# Patient Record
Sex: Female | Born: 2000 | Race: Black or African American | Hispanic: No | Marital: Single | Smoking: Never smoker
Health system: Southern US, Community
[De-identification: ages and names within clinical notes are randomized; demographics above are authoritative.]

## PROBLEM LIST (undated history)

## (undated) DIAGNOSIS — J45909 Unspecified asthma, uncomplicated: Secondary | ICD-10-CM

---
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
## 2021-03-01 ENCOUNTER — Other Ambulatory Visit: Payer: Self-pay

## 2021-03-01 ENCOUNTER — Encounter (HOSPITAL_COMMUNITY): Payer: Self-pay

## 2021-03-01 ENCOUNTER — Ambulatory Visit (HOSPITAL_COMMUNITY)
Admission: EM | Admit: 2021-03-01 | Discharge: 2021-03-01 | Disposition: A | Discharge status: Home / Self Care | Payer: Medicaid Other | Attending: Emergency Medicine | Admitting: Emergency Medicine

## 2021-03-01 ENCOUNTER — Emergency Department (HOSPITAL_COMMUNITY)
Admission: EM | Admit: 2021-03-01 | Discharge: 2021-03-01 | Disposition: A | Discharge status: Home / Self Care | Payer: Medicaid Other | Attending: Emergency Medicine | Admitting: Emergency Medicine

## 2021-03-01 ENCOUNTER — Emergency Department (HOSPITAL_COMMUNITY): Payer: Medicaid Other

## 2021-03-01 DIAGNOSIS — R079 Chest pain, unspecified: Secondary | ICD-10-CM | POA: Diagnosis not present

## 2021-03-01 DIAGNOSIS — R509 Fever, unspecified: Secondary | ICD-10-CM | POA: Insufficient documentation

## 2021-03-01 DIAGNOSIS — Z20822 Contact with and (suspected) exposure to covid-19: Secondary | ICD-10-CM | POA: Insufficient documentation

## 2021-03-01 DIAGNOSIS — Z5321 Procedure and treatment not carried out due to patient leaving prior to being seen by health care provider: Secondary | ICD-10-CM | POA: Diagnosis not present

## 2021-03-01 DIAGNOSIS — M25512 Pain in left shoulder: Secondary | ICD-10-CM

## 2021-03-01 DIAGNOSIS — R1030 Lower abdominal pain, unspecified: Secondary | ICD-10-CM | POA: Insufficient documentation

## 2021-03-01 DIAGNOSIS — R52 Pain, unspecified: Secondary | ICD-10-CM | POA: Diagnosis not present

## 2021-03-01 DIAGNOSIS — U071 COVID-19: Secondary | ICD-10-CM

## 2021-03-01 LAB — CBC
HCT: 31.2 % — ABNORMAL LOW (ref 36.0–46.0)
Hemoglobin: 9 g/dL — ABNORMAL LOW (ref 12.0–15.0)
MCH: 22.2 pg — ABNORMAL LOW (ref 26.0–34.0)
MCHC: 28.8 g/dL — ABNORMAL LOW (ref 30.0–36.0)
MCV: 76.8 fL — ABNORMAL LOW (ref 80.0–100.0)
Platelets: 320 10*3/uL (ref 150–400)
RBC: 4.06 MIL/uL (ref 3.87–5.11)
RDW: 15.4 % (ref 11.5–15.5)
WBC: 6 10*3/uL (ref 4.0–10.5)
nRBC: 0 % (ref 0.0–0.2)

## 2021-03-01 LAB — COMPREHENSIVE METABOLIC PANEL
ALT: 22 U/L (ref 0–44)
AST: 29 U/L (ref 15–41)
Albumin: 3.5 g/dL (ref 3.5–5.0)
Alkaline Phosphatase: 37 U/L — ABNORMAL LOW (ref 38–126)
Anion gap: 7 (ref 5–15)
BUN: 9 mg/dL (ref 6–20)
CO2: 23 mmol/L (ref 22–32)
Calcium: 9.1 mg/dL (ref 8.9–10.3)
Chloride: 108 mmol/L (ref 98–111)
Creatinine, Ser: 0.68 mg/dL (ref 0.44–1.00)
GFR, Estimated: >60 mL/min (ref >60)
Glucose, Bld: 97 mg/dL (ref 70–99)
Potassium: 4 mmol/L (ref 3.5–5.1)
Sodium: 138 mmol/L (ref 135–145)
Total Bilirubin: 0.4 mg/dL (ref 0.3–1.2)
Total Protein: 7 g/dL (ref 6.5–8.1)

## 2021-03-01 LAB — RESP PANEL BY RT-PCR (FLU A&B, COVID) ARPGX2
Influenza A by PCR: NEGATIVE
Influenza B by PCR: NEGATIVE
SARS Coronavirus 2 by RT PCR: NEGATIVE

## 2021-03-01 LAB — I-STAT BETA HCG BLOOD, ED (MC, WL, AP ONLY): I-stat hCG, quantitative: <5 m[IU]/mL (ref <5)

## 2021-03-01 LAB — LIPASE, BLOOD: Lipase: 35 U/L (ref 11–51)

## 2021-03-01 MED ORDER — PREDNISONE 10 MG (21) PO TBPK: ORAL_TABLET | Freq: Every day | ORAL | 0 refills | Status: DC

## 2021-03-01 NOTE — ED Provider Notes (Signed)
[facility]    CSN: [id number] Arrival date & time: 03/01/21  1327      History   Chief Complaint Chief Complaint  Patient presents with   Chest Pain    HPI Christine Barr is a 20 y.o. female.   Pt left the er with testing completed due to wait times and is here for lt shoulder pain, chest pain, body aches for the past few days. States that she is taking motrin but no relief. Covid and flu test negative from er.    Past Medical History:  Diagnosis Date   Asthma     There are no problems to display for this patient.   History reviewed. No pertinent surgical history.  OB History   No obstetric history on file.      Home Medications    Prior to Admission medications   Medication Sig Start Date End Date Taking? Authorizing Provider  predniSONE (STERAPRED UNI-PAK 21 TAB) 10 MG (21) TBPK tablet Take by mouth daily. Take 6 tabs by mouth daily  for 2 days, then 5 tabs for 2 days, then 4 tabs for 2 days, then 3 tabs for 2 days, 2 tabs for 2 days, then 1 tab by mouth daily for 2 days 03/01/21  Yes Coralyn Mark, NP  HYDROcodone-acetaminophen (NORCO) 5-325 MG tablet Take 1 tablet by mouth every 4 (four) hours as needed for moderate pain. 10/31/20   Gilles Chiquito, MD  ondansetron (ZOFRAN) 4 MG tablet Take 1 tablet (4 mg total) by mouth every 8 (eight) hours as needed for up to 10 doses for nausea or vomiting. 10/31/20   Gilles Chiquito, MD    Family History History reviewed. No pertinent family history.  Social History Social History   Tobacco Use   Smoking status: Passive Smoke Exposure - Never Smoker   Smokeless tobacco: Never  Substance Use Topics   Alcohol use: No     Allergies   Patient has no known allergies.   Review of Systems Review of Systems  Constitutional:  Positive for fatigue.  HENT:  Positive for congestion and rhinorrhea.   Eyes: Negative.   Respiratory:  Positive for cough.   Cardiovascular:  Positive for chest pain.   Genitourinary: Negative.        Currently on cycle   Musculoskeletal:        Lt shoulder pain   Neurological: Negative.     Physical Exam Triage Vital Signs ED Triage Vitals  Enc Vitals Group     BP 03/01/21 1401 105/64     Pulse Rate 03/01/21 1401 80     Resp 03/01/21 1401 18     Temp 03/01/21 1401 98.3 F (36.8 C)     Temp Source 03/01/21 1401 Oral     SpO2 03/01/21 1401 99 %     Weight --      Height --      Head Circumference --      Peak Flow --      Pain Score 03/01/21 1358 8     Pain Loc --      Pain Edu? --      Excl. in [place]? --    No data found.  Updated Vital Signs BP 105/64 (BP Location: Right Arm)   Pulse 80   Temp 98.3 F (36.8 C) (Oral)   Resp 18   LMP 02/21/2021 (Approximate)   SpO2 99%   Visual Acuity Right Eye Distance:   Left Eye  Distance:   Bilateral Distance:    Right Eye Near:   Left Eye Near:    Bilateral Near:     Physical Exam Constitutional:      Appearance: She is well-developed.  Cardiovascular:     Rate and Rhythm: Normal rate.     Heart sounds: Normal heart sounds.  Pulmonary:     Breath sounds: Normal breath sounds.  Abdominal:     Palpations: Abdomen is soft.  Musculoskeletal:        General: Normal range of motion.     Cervical back: Normal range of motion.  Skin:    General: Skin is warm.  Neurological:     General: No focal deficit present.     Mental Status: She is alert.     UC Treatments / Results  Labs (all labs ordered are listed, but only abnormal results are displayed) Labs Reviewed - No data to display  EKG   Radiology DG Chest 1 View  Result Date: 03/01/2021 CLINICAL DATA:  Malaise, fever and left upper chest pain. EXAM: CHEST  1 VIEW COMPARISON:  01/08/2016 FINDINGS: The heart size and mediastinal contours are within normal limits. There is no evidence of pulmonary edema, consolidation, pneumothorax or pleural fluid. The visualized skeletal structures are unremarkable. IMPRESSION: No active  disease. Electronically Signed   By: Irish Lack M.D.   On: 03/01/2021 08:58    Procedures Procedures (including critical care time)  Medications Ordered in UC Medications - No data to display  Initial Impression / Assessment and Plan / UC Course  I have reviewed the triage vital signs and the nursing notes.  Pertinent labs & imaging results that were available during my care of the patient were reviewed by me and considered in my medical decision making (see chart for details).     You may have a viral illness your test from the er was negative  You are slightly anemic you are currently on your cycle make sure to push fluids stay hydrated  You may have symptoms upto 10 days  Take tylenol or motrin as needed for pain  Final Clinical Impressions(s) / UC Diagnoses   Final diagnoses:  Chest pain, unspecified type  Body aches  Pain in joint of left shoulder     Discharge Instructions      You may have a viral illness your test from the er was negative  You are slightly anemic you are currently on your cycle make sure to push fluids stay hydrated  You may have symptoms upto 10 days  Take tylenol or motrin as needed for pain      ED Prescriptions     Medication Sig Dispense Auth. Provider   predniSONE (STERAPRED UNI-PAK 21 TAB) 10 MG (21) TBPK tablet Take by mouth daily. Take 6 tabs by mouth daily  for 2 days, then 5 tabs for 2 days, then 4 tabs for 2 days, then 3 tabs for 2 days, 2 tabs for 2 days, then 1 tab by mouth daily for 2 days 42 tablet Coralyn Mark, NP      PDMP not reviewed this encounter.   Coralyn Mark, NP 03/01/21 1444

## 2021-03-01 NOTE — ED Triage Notes (Addendum)
Pt reports having pain that starts in shoulder and radiates to chest (sharp). There is a history of asthma. Pt states she has been having more headaches and for the past week she has been having abd cramping to LLQ.  Start: this morning

## 2021-03-01 NOTE — Discharge Instructions (Addendum)
You may have a viral illness your test from the er was negative  You are slightly anemic you are currently on your cycle make sure to push fluids stay hydrated  You may have symptoms upto 10 days  Take tylenol or motrin as needed for pain

## 2021-03-01 NOTE — ED Triage Notes (Signed)
Pt states she has been feeling general malaise for several days. Fever two days ago, treated with tylenol. States she woke up this morning and was also having L shoulder pain, no known injury to area. Pt also states she has lower abdominal pain.

## 2021-09-02 ENCOUNTER — Other Ambulatory Visit: Payer: Self-pay

## 2021-09-02 ENCOUNTER — Encounter: Payer: Self-pay | Admitting: Emergency Medicine

## 2021-09-02 ENCOUNTER — Emergency Department
Admission: EM | Admit: 2021-09-02 | Discharge: 2021-09-02 | Disposition: A | Discharge status: Home / Self Care | Payer: Medicaid Other | Attending: Emergency Medicine | Admitting: Emergency Medicine

## 2021-09-02 DIAGNOSIS — T7840XA Allergy, unspecified, initial encounter: Secondary | ICD-10-CM

## 2021-09-02 MED ORDER — IPRATROPIUM-ALBUTEROL 0.5-2.5 (3) MG/3ML IN SOLN
3.0000 mL | Freq: Once | RESPIRATORY_TRACT | Status: AC
  Administered 2021-09-02: 3 mL via RESPIRATORY_TRACT
  Filled 2021-09-02: qty 3

## 2021-09-02 MED ORDER — PREDNISONE 20 MG PO TABS
50.0000 mg | ORAL_TABLET | Freq: Once | ORAL | Status: AC
  Administered 2021-09-02: 50 mg via ORAL
  Filled 2021-09-02: qty 3

## 2021-09-02 MED ORDER — PREDNISONE 50 MG PO TABS: 50.0000 mg | ORAL_TABLET | Freq: Every day | ORAL | 0 refills | Status: AC

## 2021-09-02 NOTE — ED Triage Notes (Signed)
Pt via POV from home. Pt states that she woke up with itchy hives. States that her lips were swollen but it has gone down. Pt took Benadryl at 0900 this AM. Pt is A&Ox4 and NAD.

## 2021-09-02 NOTE — ED Provider Notes (Signed)
[facility] Provider Note    Event Date/Time   First MD Initiated Contact with Patient 09/02/21 1645     (approximate)   History   Allergic Reaction   HPI  Christine Barr is a 21 y.o. female with no significant past medical history who presents with complaints of allergic reaction.  Patient reports she woke up this morning with mild swelling of her lips and an itchy rash consistent with hives.  She reports she took Benadryl and symptoms improved.  Denies difficulty swallowing or breathing although occasionally she feels a tightness in her chest.  She reports itching returned at work so she came to the emergency department.  No history of anaphylaxis or allergic reactions.     Physical Exam   Triage Vital Signs: ED Triage Vitals  Enc Vitals Group     BP 09/02/21 1648 122/76     Pulse Rate 09/02/21 1648 99     Resp 09/02/21 1648 18     Temp 09/02/21 1648 98.6 F (37 C)     Temp Source 09/02/21 1648 Oral     SpO2 09/02/21 1648 100 %     Weight 09/02/21 1640 63.5 kg (140 lb)     Height 09/02/21 1640 1.626 m (5\' 4" )     Head Circumference --      Peak Flow --      Pain Score 09/02/21 1640 0     Pain Loc --      Pain Edu? --      Excl. in [place]? --     Most recent vital signs: Vitals:   09/02/21 1648  BP: 122/76  Pulse: 99  Resp: 18  Temp: 98.6 F (37 C)  SpO2: 100%     General: Awake, no distress.  CV:  Good peripheral perfusion.  Resp:  Normal effort.  Clear to auscultation bilaterally Abd:  No distention.  Other:  Pharynx normal, no swelling, no stridor, no rash   ED Results / Procedures / Treatments   Labs (all labs ordered are listed, but only abnormal results are displayed) Labs Reviewed - No data to display   EKG     RADIOLOGY     PROCEDURES:  Critical Care performed:   Procedures   MEDICATIONS ORDERED IN ED: Medications  ipratropium-albuterol (DUONEB) 0.5-2.5 (3) MG/3ML nebulizer solution 3 mL (3 mLs  Nebulization Given 09/02/21 1656)  predniSONE (DELTASONE) tablet 50 mg (50 mg Oral Given 09/02/21 1655)     IMPRESSION / MDM / ASSESSMENT AND PLAN / ED COURSE  I reviewed the triage vital signs and the nursing notes.  Patient overall well-appearing in no acute distress presents with symptoms consistent with allergic reaction.  Exam is overall reassuring.  Will treat with p.o. prednisone, patient will be driving so advised her to take additional Benadryl when she gets home.  Will give DuoNeb as well  No indication for admission and is not consistent with anaphylaxis, patient is to return if any change in her symptoms.          FINAL CLINICAL IMPRESSION(S) / ED DIAGNOSES   Final diagnoses:  Allergic reaction, initial encounter     Rx / DC Orders   ED Discharge Orders          Ordered    predniSONE (DELTASONE) 50 MG tablet  Daily with breakfast        09/02/21 1701             Note:  This  document was prepared using Systems analyst and may include unintentional dictation errors.   Lavonia Drafts, MD 09/02/21 [phone]

## 2022-09-11 ENCOUNTER — Ambulatory Visit (HOSPITAL_COMMUNITY)
Admission: EM | Admit: 2022-09-11 | Discharge: 2022-09-11 | Disposition: A | Discharge status: Home / Self Care | Payer: Medicaid Other | Attending: Internal Medicine | Admitting: Internal Medicine

## 2022-09-11 ENCOUNTER — Other Ambulatory Visit: Payer: Self-pay

## 2022-09-11 ENCOUNTER — Encounter (HOSPITAL_COMMUNITY): Payer: Self-pay | Admitting: Emergency Medicine

## 2022-09-11 DIAGNOSIS — Z3202 Encounter for pregnancy test, result negative: Secondary | ICD-10-CM

## 2022-09-11 DIAGNOSIS — R112 Nausea with vomiting, unspecified: Secondary | ICD-10-CM | POA: Diagnosis not present

## 2022-09-11 DIAGNOSIS — N3000 Acute cystitis without hematuria: Secondary | ICD-10-CM | POA: Diagnosis not present

## 2022-09-11 LAB — POCT URINALYSIS DIPSTICK, ED / UC
Bilirubin Urine: NEGATIVE
Glucose, UA: NEGATIVE mg/dL
Hgb urine dipstick: NEGATIVE
Ketones, ur: NEGATIVE mg/dL
Leukocytes,Ua: NEGATIVE
Nitrite: POSITIVE — AB
Protein, ur: NEGATIVE mg/dL
Specific Gravity, Urine: 1.025 (ref 1.005–1.030)
Urobilinogen, UA: 1 mg/dL (ref 0.0–1.0)
pH: 7 (ref 5.0–8.0)

## 2022-09-11 LAB — POC URINE PREG, ED: Preg Test, Ur: NEGATIVE

## 2022-09-11 MED ORDER — ONDANSETRON 4 MG PO TBDP: 4.0000 mg | ORAL_TABLET | Freq: Three times a day (TID) | ORAL | 0 refills | Status: AC | PRN

## 2022-09-11 MED ORDER — ONDANSETRON 4 MG PO TBDP
4.0000 mg | ORAL_TABLET | Freq: Once | ORAL | Status: AC
  Administered 2022-09-11: 4 mg via ORAL

## 2022-09-11 MED ORDER — ONDANSETRON 4 MG PO TBDP
ORAL_TABLET | ORAL | Status: AC
  Filled 2022-09-11: qty 1

## 2022-09-11 MED ORDER — CEPHALEXIN 500 MG PO CAPS: 500.0000 mg | ORAL_CAPSULE | Freq: Two times a day (BID) | ORAL | 0 refills | Status: AC

## 2022-09-11 NOTE — ED Provider Notes (Signed)
[place]    CSN: [id number] Arrival date & time: 09/11/22  0857      History   Chief Complaint Chief Complaint  Patient presents with   Emesis    HPI Christine Barr is a 22 y.o. female.   Patient presents urgent care for evaluation of nausea, vomiting, suprapubic abdominal discomfort, low back pain that started 3 days ago.  She has had multiple episodes of nonbilious/nonbloody emesis over the last 3 days.  She denies diarrhea, fever/chills, viral URI symptoms, sore throat, flank pain, urinary symptoms, vaginal symptoms, rash, and recent intake of foods outside of normal diet.  She denies acid reflux symptoms, epigastric abdominal discomfort, neck pain, generalized bodyaches, recent COVID-19 infection, and recent known sick contacts.  No recent antibiotic or steroid use.  Denies recent new sexual partners or known exposure to STD.  She is sexually active without condoms with her monogamous female partner.  Last menstrual cycle was September 04, 2022.  Menstrual cycles are regular.  She has not attempted use of any over-the-counter medications before coming to urgent care for her symptoms.   Emesis   Past Medical History:  Diagnosis Date   Asthma     There are no problems to display for this patient.   History reviewed. No pertinent surgical history.  OB History   No obstetric history on file.      Home Medications    Prior to Admission medications   Medication Sig Start Date End Date Taking? Authorizing Provider  cephALEXin (KEFLEX) 500 MG capsule Take 1 capsule (500 mg total) by mouth 2 (two) times daily for 7 days. 09/11/22 09/18/22 Yes Talbot Grumbling, FNP  ondansetron (ZOFRAN-ODT) 4 MG disintegrating tablet Take 1 tablet (4 mg total) by mouth every 8 (eight) hours as needed for nausea or vomiting. 09/11/22  Yes Talbot Grumbling, FNP  HYDROcodone-acetaminophen (NORCO) 5-325 MG tablet Take 1 tablet by mouth every 4 (four) hours as needed for  moderate pain. Patient not taking: Reported on 09/11/2022 10/31/20   Lucrezia Starch, MD  predniSONE (DELTASONE) 50 MG tablet Take 1 tablet (50 mg total) by mouth daily with breakfast. Patient not taking: Reported on 09/11/2022 09/02/21   Lavonia Drafts, MD    Family History History reviewed. No pertinent family history.  Social History Social History   Tobacco Use   Smoking status: Never    Passive exposure: Yes   Smokeless tobacco: Never  Vaping Use   Vaping Use: Every day  Substance Use Topics   Alcohol use: Not Currently   Drug use: Never     Allergies   Patient has no known allergies.   Review of Systems Review of Systems  Gastrointestinal:  Positive for vomiting.  Per HPI   Physical Exam Triage Vital Signs ED Triage Vitals  Enc Vitals Group     BP 09/11/22 1108 117/75     Pulse Rate 09/11/22 1108 67     Resp 09/11/22 1108 16     Temp 09/11/22 1108 98.5 F (36.9 C)     Temp Source 09/11/22 1108 Oral     SpO2 09/11/22 1108 100 %     Weight --      Height --      Head Circumference --      Peak Flow --      Pain Score 09/11/22 1105 6     Pain Loc --      Pain Edu? --      Excl.  in [place]? --    No data found.  Updated Vital Signs BP 117/75 (BP Location: Left Arm)   Pulse 67   Temp 98.5 F (36.9 C) (Oral)   Resp 16   LMP 09/04/2022   SpO2 100%   Visual Acuity Right Eye Distance:   Left Eye Distance:   Bilateral Distance:    Right Eye Near:   Left Eye Near:    Bilateral Near:     Physical Exam Vitals and nursing note reviewed.  Constitutional:      Appearance: She is not ill-appearing or toxic-appearing.  HENT:     Head: Normocephalic and atraumatic.     Right Ear: Hearing and external ear normal.     Left Ear: Hearing and external ear normal.     Nose: Nose normal.     Mouth/Throat:     Lips: Pink.     Mouth: Mucous membranes are moist. No injury.     Tongue: No lesions. Tongue does not deviate from midline.     Palate: No mass and  lesions.     Pharynx: Oropharynx is clear. Uvula midline. No pharyngeal swelling, oropharyngeal exudate, posterior oropharyngeal erythema or uvula swelling.     Tonsils: No tonsillar exudate or tonsillar abscesses.  Eyes:     General: Lids are normal. Vision grossly intact. Gaze aligned appropriately.     Extraocular Movements: Extraocular movements intact.     Conjunctiva/sclera: Conjunctivae normal.  Cardiovascular:     Rate and Rhythm: Normal rate and regular rhythm.     Heart sounds: Normal heart sounds, S1 normal and S2 normal.  Pulmonary:     Effort: Pulmonary effort is normal. No respiratory distress.     Breath sounds: Normal breath sounds and air entry.  Abdominal:     General: Abdomen is flat. Bowel sounds are normal.     Palpations: Abdomen is soft.     Tenderness: There is abdominal tenderness in the suprapubic area. There is no right CVA tenderness, left CVA tenderness or guarding.     Comments: No peritoneal signs.  Musculoskeletal:     Cervical back: Neck supple.  Skin:    General: Skin is warm and dry.     Capillary Refill: Capillary refill takes less than 2 seconds.     Findings: No rash.  Neurological:     General: No focal deficit present.     Mental Status: She is alert and oriented to person, place, and time. Mental status is at baseline.     Cranial Nerves: No dysarthria or facial asymmetry.  Psychiatric:        Mood and Affect: Mood normal.        Speech: Speech normal.        Behavior: Behavior normal.        Thought Content: Thought content normal.        Judgment: Judgment normal.      UC Treatments / Results  Labs (all labs ordered are listed, but only abnormal results are displayed) Labs Reviewed  POCT URINALYSIS DIPSTICK, ED / UC - Abnormal; Notable for the following components:      Result Value   Nitrite POSITIVE (*)    All other components within normal limits  URINE CULTURE  POC URINE PREG, ED    EKG   Radiology No results  found.  Procedures Procedures (including critical care time)  Medications Ordered in UC Medications  ondansetron (ZOFRAN-ODT) disintegrating tablet 4 mg (4 mg Oral Given 09/11/22 1112)  Initial Impression / Assessment and Plan / UC Course  I have reviewed the triage vital signs and the nursing notes.  Pertinent labs & imaging results that were available during my care of the patient were reviewed by me and considered in my medical decision making (see chart for details).   1.  Nausea and vomiting, acute cystitis without hematuria, negative pregnancy test Urinalysis is nitrite positive without leukocytes or hematuria.  I would like to go ahead and treat for likely urinary tract infection based on patient's symptoms and positive nitrites in urine.  Keflex twice daily for the next 7 days sent to pharmacy to be taken as directed.  Advised to take this medicine with food to avoid stomach upset.  She is to push fluids to stay well-hydrated.  Zofran 4 mg ODT given in clinic, may use this at home every 8 hours as needed for nausea and vomiting as well.  She is clinically nontoxic in appearance with hemodynamically stable vital signs.  No peritoneal signs to abdominal exam, therefore no indication for referral to the emergency department.  She is not tender to her flank region and pregnancy test is negative.  Bland foods for the next 12 to 24 hours recommended, then may increase diet as tolerated.  She is agreeable with this plan.  Work note given.   Discussed physical exam and available lab work findings in clinic with patient.  Counseled patient regarding appropriate use of medications and potential side effects for all medications recommended or prescribed today. Discussed red flag signs and symptoms of worsening condition,when to call the PCP office, return to urgent care, and when to seek higher level of care in the emergency department. Patient verbalizes understanding and agreement with plan. All  questions answered. Patient discharged in stable condition.    Final Clinical Impressions(s) / UC Diagnoses   Final diagnoses:  Nausea and vomiting, unspecified vomiting type  Acute cystitis without hematuria     Discharge Instructions      Your evaluation suggests that your symptoms are most likely due to viral stomach illness (gastroenteritis) which will improve on its own with rest and fluids in the next few days.   You also have urinary tract affection.  I would like for you to take Keflex antibiotic twice daily for the next 7 days to treat the UTI. Drink plenty of fluids and avoid liquids like soda, coffee, and other caffeinated beverages as these can make you dehydrated. I have sent your urine for culture in the lab, I will call you if we need to change your antibiotic based off of the urine culture.  I have prescribed an antinausea medication for you to take at home called Zofran. It is the same medication that we gave you in the office.  You may use tylenol 1,033m every 6 hours over the counter as needed for abdominal discomfort related to this virus.   Eat a bland diet for the next 12-24 hours (bananas, rice, white toast, and applesauce) once you are able to tolerate liquids (broth, etc). These foods are easy for your stomach to digest. Pedialyte can be purchased to help with rehydration. Drink plenty of water.   Please follow up with your primary care provider for further management. Return if you experience worsening or uncontrolled pain, inability to tolerate fluids by mouth, difficulty breathing, fevers 100.80F or greater, recurrent vomiting, or any other concerning symptoms. I hope you feel better!      ED Prescriptions  Medication Sig Dispense Auth. Provider   cephALEXin (KEFLEX) 500 MG capsule Take 1 capsule (500 mg total) by mouth 2 (two) times daily for 7 days. 14 capsule Joella Prince M, FNP   ondansetron (ZOFRAN-ODT) 4 MG disintegrating tablet Take 1  tablet (4 mg total) by mouth every 8 (eight) hours as needed for nausea or vomiting. 20 tablet Talbot Grumbling, FNP      PDMP not reviewed this encounter.   Talbot Grumbling, [place] 09/11/22 1214

## 2022-09-11 NOTE — Discharge Instructions (Addendum)
Your evaluation suggests that your symptoms are most likely due to viral stomach illness (gastroenteritis) which will improve on its own with rest and fluids in the next few days.   You also have urinary tract affection.  I would like for you to take Keflex antibiotic twice daily for the next 7 days to treat the UTI. Drink plenty of fluids and avoid liquids like soda, coffee, and other caffeinated beverages as these can make you dehydrated. I have sent your urine for culture in the lab, I will call you if we need to change your antibiotic based off of the urine culture.  I have prescribed an antinausea medication for you to take at home called Zofran. It is the same medication that we gave you in the office.  You may use tylenol 1,042m every 6 hours over the counter as needed for abdominal discomfort related to this virus.   Eat a bland diet for the next 12-24 hours (bananas, rice, white toast, and applesauce) once you are able to tolerate liquids (broth, etc). These foods are easy for your stomach to digest. Pedialyte can be purchased to help with rehydration. Drink plenty of water.   Please follow up with your primary care provider for further management. Return if you experience worsening or uncontrolled pain, inability to tolerate fluids by mouth, difficulty breathing, fevers 100.89F or greater, recurrent vomiting, or any other concerning symptoms. I hope you feel better!

## 2022-09-11 NOTE — ED Triage Notes (Signed)
Symptoms started last night around 10 pm.  Patient reports abdominal cramping, back pain, nausea, lightheadedness.   Vomiting started today.  Has had 3 episodes of vomiting today.  Denies diarrhea.  Denies urinary issues.  Denies vaginal discharge.  Last BM was yesterday and normal per patient.    Has not had any medicines for symptoms

## 2022-09-12 LAB — URINE CULTURE: Culture: >=100000 — AB

## 2022-10-22 IMAGING — CT CT RENAL STONE PROTOCOL
2 of 4 series · 16 of 46 positions shown, 18 images · non-contrast
Comparison: None.

CLINICAL DATA: Left upper quadrant pain.

EXAM:
CT ABDOMEN AND PELVIS WITHOUT CONTRAST
TECHNIQUE: Multidetector CT imaging of the abdomen and pelvis was performed
following the standard protocol without IV contrast.

[Series 2: stone full standard · axial · 0.68mm/px · z∈[-421,-41]mm · 13 of 84 slices shown, 15 images]
[im 4/84  soft-tissue]
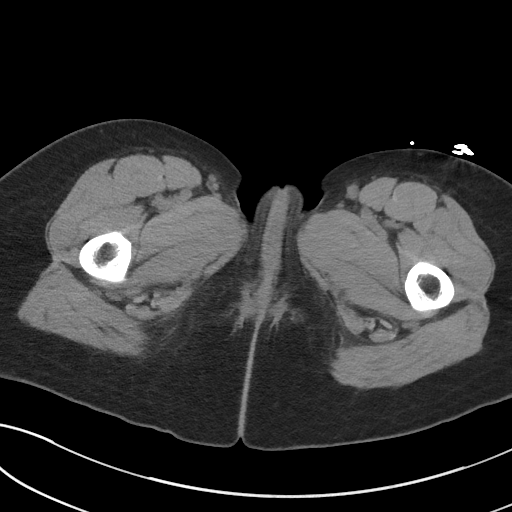
[im 4/84  bone]
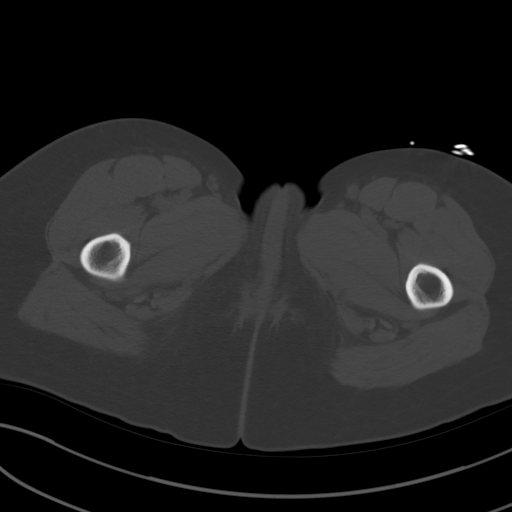
[im 12/84  soft-tissue]
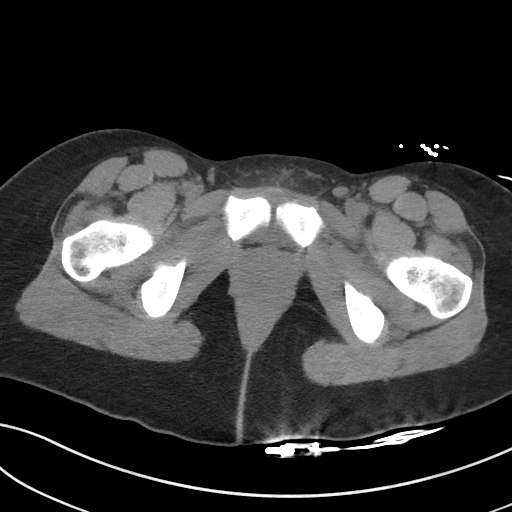
[im 19/84  soft-tissue]
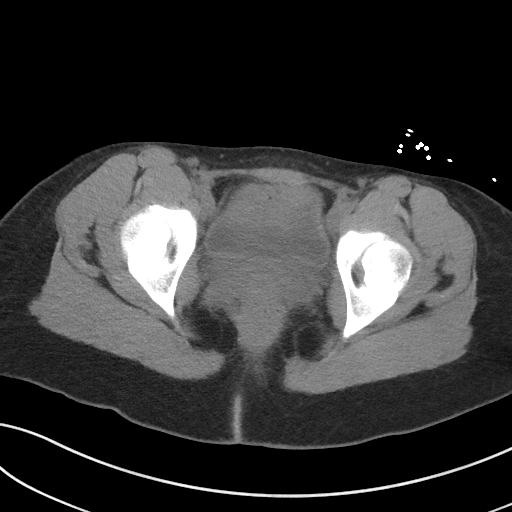
[im 23/84  soft-tissue]
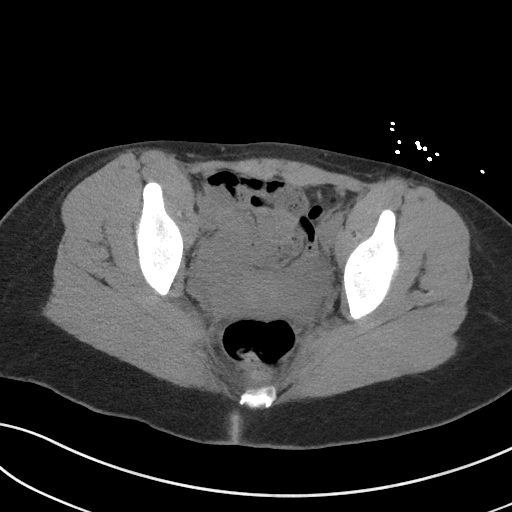
[im 31/84  soft-tissue]
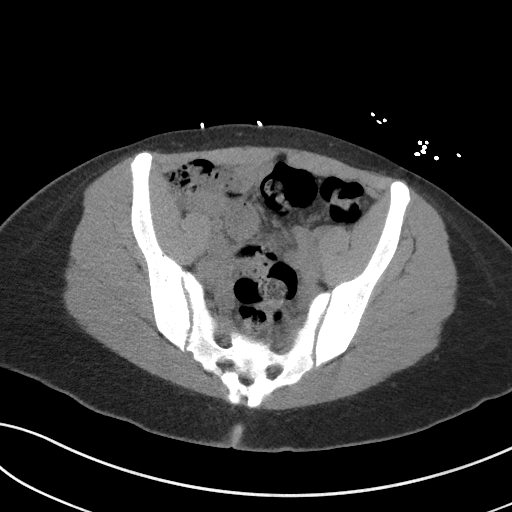
[im 34/84  soft-tissue]
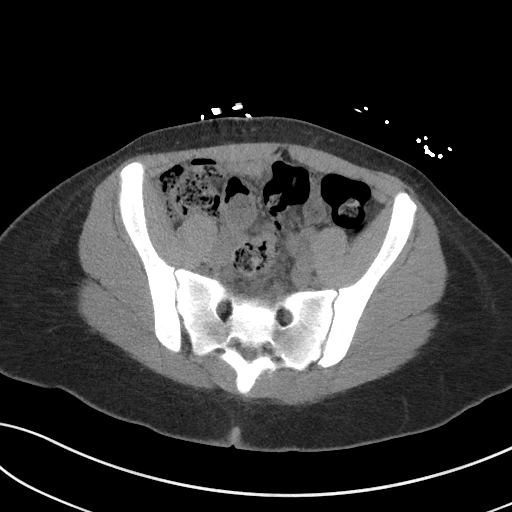
[im 42/84  soft-tissue]
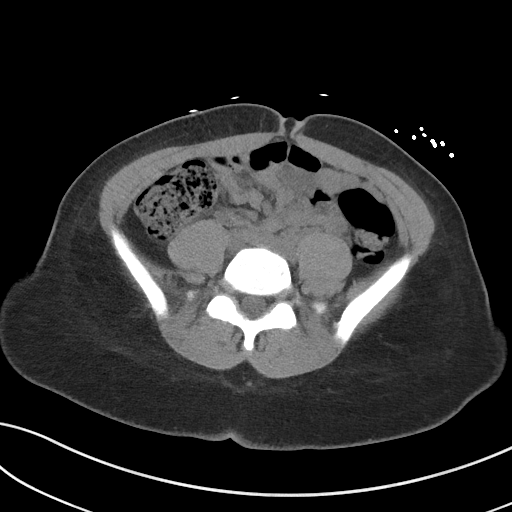
[im 50/84  soft-tissue]
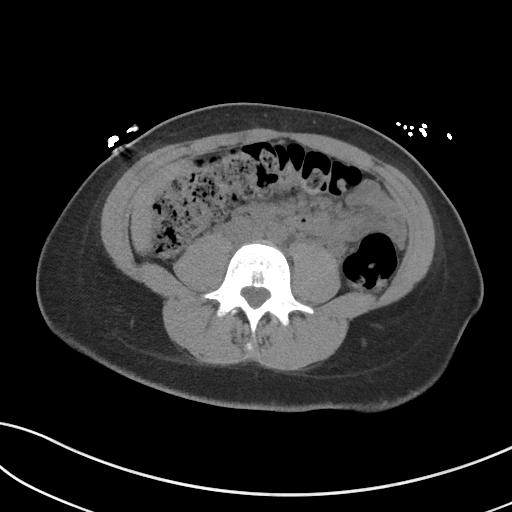
[im 53/84  soft-tissue]
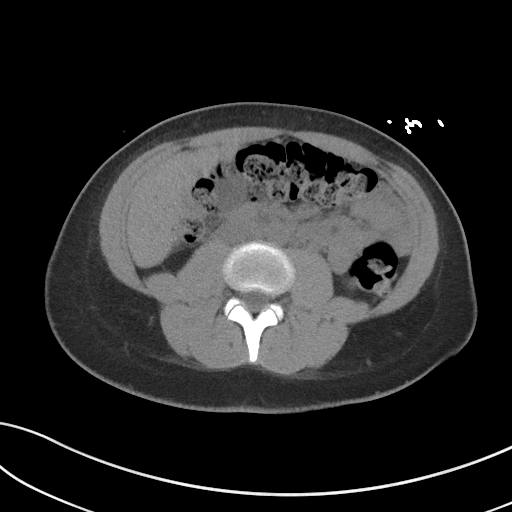
[im 53/84  bone]
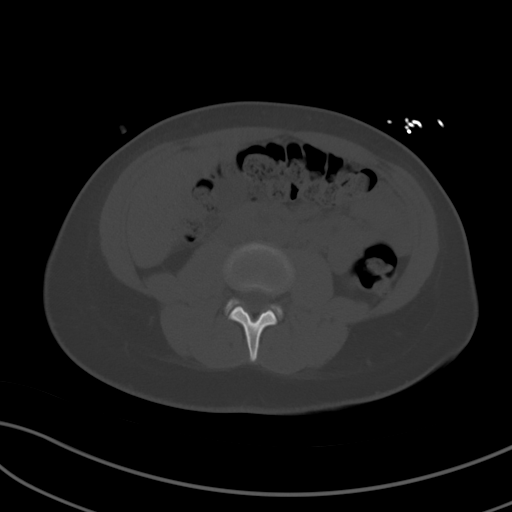
[im 61/84  soft-tissue]
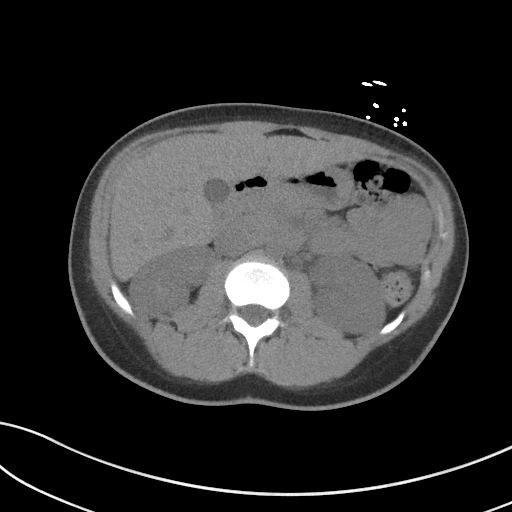
[im 65/84  soft-tissue]
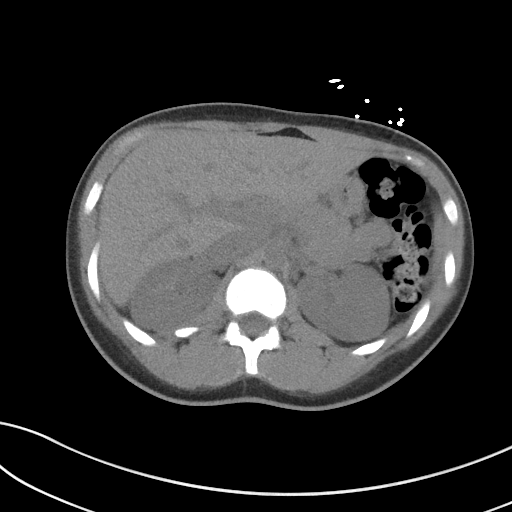
[im 72/84  soft-tissue]
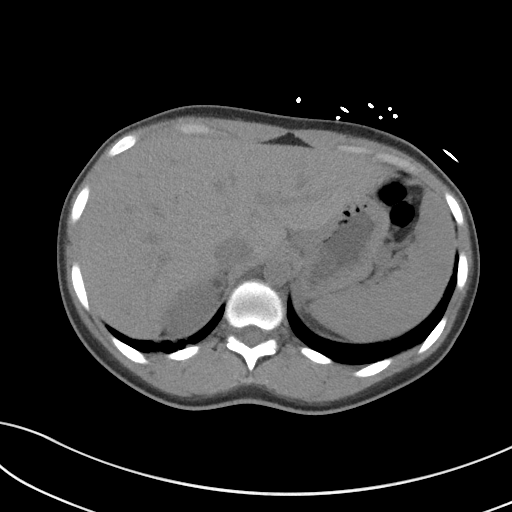
[im 80/84  soft-tissue]
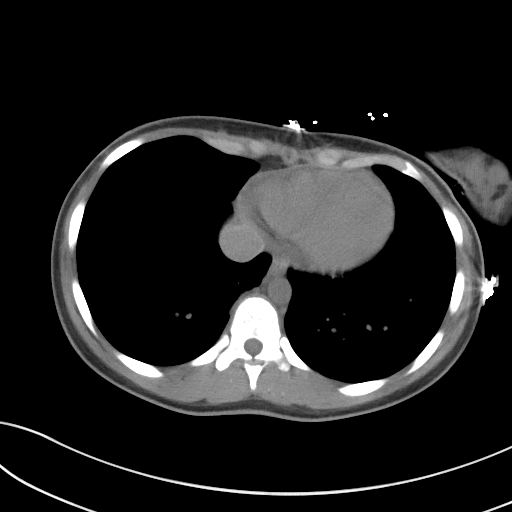

[Series 5: coronal · coronal · 0.63mm/px · 3 of 123 slices shown]
[im 41/123  soft-tissue]
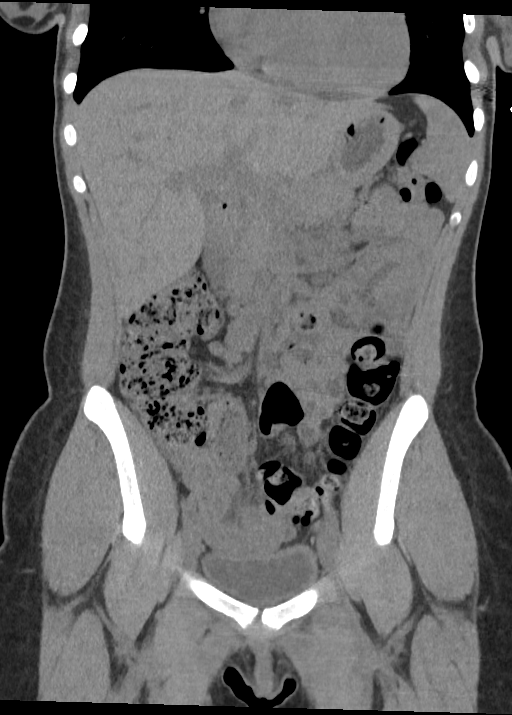
[im 55/123  soft-tissue]
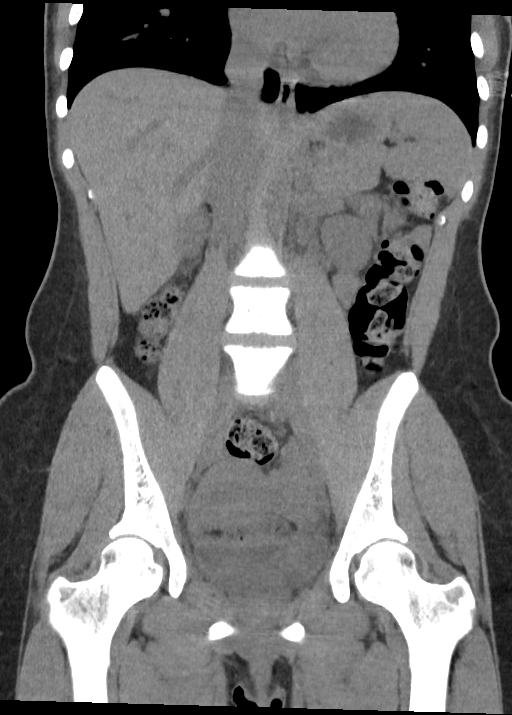
[im 68/123  soft-tissue]
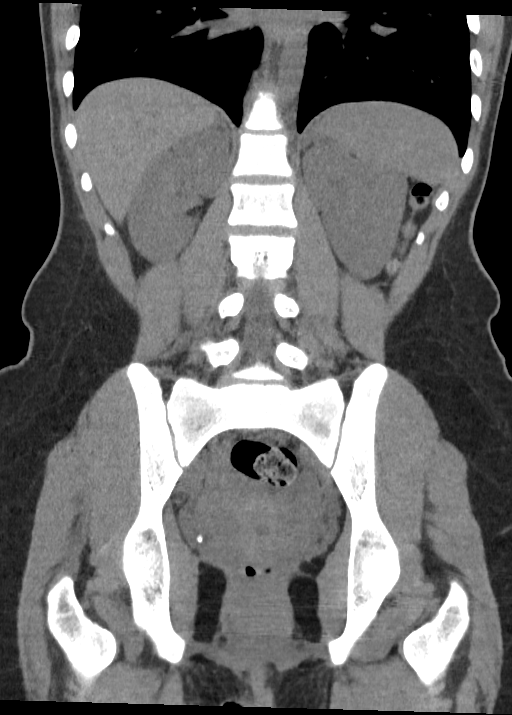

[16 of 46 positions shown; findings below may reference images not displayed]

FINDINGS: Lower chest: Very mild atelectasis is seen within the posterior
aspect of the bilateral lung bases.

Hepatobiliary: No focal liver abnormality is seen. No gallstones,
gallbladder wall thickening, or biliary dilatation.

Pancreas: Unremarkable. No pancreatic ductal dilatation or
surrounding inflammatory changes.

Spleen: Normal in size without focal abnormality.

Adrenals/Urinary Tract: Adrenal glands are unremarkable. Kidneys are
normal, without renal calculi, focal lesion, or hydronephrosis.
Bladder is unremarkable.

Stomach/Bowel: Stomach is within normal limits. Appendix appears
normal. A large amount of stool is seen throughout the colon. No
evidence of bowel wall thickening, distention, or inflammatory
changes.

Vascular/Lymphatic: No significant vascular findings are present. No
enlarged abdominal or pelvic lymph nodes.

Reproductive: Uterus and bilateral adnexa are unremarkable.

Other: No abdominal wall hernia or abnormality. No abdominopelvic
ascites.

Musculoskeletal: No acute or significant osseous findings.
IMPRESSION: No acute intra-abdominal findings.
